# Patient Record
Sex: Female | Born: 1955 | Race: White | Hispanic: No | Marital: Married
Health system: Southern US, Community
[De-identification: ages and names within clinical notes are randomized; demographics above are authoritative.]

---

## 2002-05-18 ENCOUNTER — Encounter: Payer: Self-pay | Admitting: Orthopedic Surgery

## 2002-05-25 ENCOUNTER — Inpatient Hospital Stay (HOSPITAL_COMMUNITY): Admission: RE | Admit: 2002-05-25 | Discharge: 2002-05-29 | Payer: Self-pay | Admitting: Orthopedic Surgery

## 2002-05-25 ENCOUNTER — Encounter: Payer: Self-pay | Admitting: Orthopedic Surgery

## 2012-01-29 DIAGNOSIS — J45901 Unspecified asthma with (acute) exacerbation: Secondary | ICD-10-CM | POA: Diagnosis not present

## 2012-01-29 DIAGNOSIS — J209 Acute bronchitis, unspecified: Secondary | ICD-10-CM | POA: Diagnosis not present

## 2012-02-01 DIAGNOSIS — J209 Acute bronchitis, unspecified: Secondary | ICD-10-CM | POA: Diagnosis not present

## 2012-02-01 DIAGNOSIS — J45901 Unspecified asthma with (acute) exacerbation: Secondary | ICD-10-CM | POA: Diagnosis not present

## 2012-03-23 DIAGNOSIS — I8310 Varicose veins of unspecified lower extremity with inflammation: Secondary | ICD-10-CM | POA: Diagnosis not present

## 2012-03-23 DIAGNOSIS — I872 Venous insufficiency (chronic) (peripheral): Secondary | ICD-10-CM | POA: Diagnosis not present

## 2012-03-24 DIAGNOSIS — R5383 Other fatigue: Secondary | ICD-10-CM | POA: Diagnosis not present

## 2012-03-24 DIAGNOSIS — E669 Obesity, unspecified: Secondary | ICD-10-CM | POA: Diagnosis not present

## 2012-03-24 DIAGNOSIS — Z713 Dietary counseling and surveillance: Secondary | ICD-10-CM | POA: Diagnosis not present

## 2012-03-24 DIAGNOSIS — R632 Polyphagia: Secondary | ICD-10-CM | POA: Diagnosis not present

## 2012-03-24 DIAGNOSIS — R5381 Other malaise: Secondary | ICD-10-CM | POA: Diagnosis not present

## 2012-04-06 DIAGNOSIS — I8310 Varicose veins of unspecified lower extremity with inflammation: Secondary | ICD-10-CM | POA: Diagnosis not present

## 2012-04-21 DIAGNOSIS — R5381 Other malaise: Secondary | ICD-10-CM | POA: Diagnosis not present

## 2012-04-21 DIAGNOSIS — E669 Obesity, unspecified: Secondary | ICD-10-CM | POA: Diagnosis not present

## 2012-05-10 DIAGNOSIS — F339 Major depressive disorder, recurrent, unspecified: Secondary | ICD-10-CM | POA: Diagnosis not present

## 2012-05-10 DIAGNOSIS — M545 Low back pain, unspecified: Secondary | ICD-10-CM | POA: Diagnosis not present

## 2012-05-10 DIAGNOSIS — K2100 Gastro-esophageal reflux disease with esophagitis, without bleeding: Secondary | ICD-10-CM | POA: Diagnosis not present

## 2012-05-10 DIAGNOSIS — J45909 Unspecified asthma, uncomplicated: Secondary | ICD-10-CM | POA: Diagnosis not present

## 2012-06-02 DIAGNOSIS — R5381 Other malaise: Secondary | ICD-10-CM | POA: Diagnosis not present

## 2012-06-02 DIAGNOSIS — R5383 Other fatigue: Secondary | ICD-10-CM | POA: Diagnosis not present

## 2012-06-02 DIAGNOSIS — E669 Obesity, unspecified: Secondary | ICD-10-CM | POA: Diagnosis not present

## 2012-06-30 DIAGNOSIS — R5381 Other malaise: Secondary | ICD-10-CM | POA: Diagnosis not present

## 2012-06-30 DIAGNOSIS — R5383 Other fatigue: Secondary | ICD-10-CM | POA: Diagnosis not present

## 2012-06-30 DIAGNOSIS — E669 Obesity, unspecified: Secondary | ICD-10-CM | POA: Diagnosis not present

## 2012-08-12 DIAGNOSIS — Z23 Encounter for immunization: Secondary | ICD-10-CM | POA: Diagnosis not present

## 2012-12-06 DIAGNOSIS — G2581 Restless legs syndrome: Secondary | ICD-10-CM | POA: Diagnosis not present

## 2012-12-06 DIAGNOSIS — G5600 Carpal tunnel syndrome, unspecified upper limb: Secondary | ICD-10-CM | POA: Diagnosis not present

## 2012-12-06 DIAGNOSIS — G609 Hereditary and idiopathic neuropathy, unspecified: Secondary | ICD-10-CM | POA: Diagnosis not present

## 2013-01-14 DIAGNOSIS — G609 Hereditary and idiopathic neuropathy, unspecified: Secondary | ICD-10-CM | POA: Diagnosis not present

## 2013-01-14 DIAGNOSIS — G5600 Carpal tunnel syndrome, unspecified upper limb: Secondary | ICD-10-CM | POA: Diagnosis not present

## 2013-05-13 DIAGNOSIS — G2581 Restless legs syndrome: Secondary | ICD-10-CM | POA: Diagnosis not present

## 2013-05-13 DIAGNOSIS — G609 Hereditary and idiopathic neuropathy, unspecified: Secondary | ICD-10-CM | POA: Diagnosis not present

## 2013-08-26 DIAGNOSIS — K2100 Gastro-esophageal reflux disease with esophagitis, without bleeding: Secondary | ICD-10-CM | POA: Diagnosis not present

## 2013-08-26 DIAGNOSIS — F339 Major depressive disorder, recurrent, unspecified: Secondary | ICD-10-CM | POA: Diagnosis not present

## 2013-08-26 DIAGNOSIS — Z1211 Encounter for screening for malignant neoplasm of colon: Secondary | ICD-10-CM | POA: Diagnosis not present

## 2013-08-26 DIAGNOSIS — Z23 Encounter for immunization: Secondary | ICD-10-CM | POA: Diagnosis not present

## 2013-12-14 DIAGNOSIS — Z1211 Encounter for screening for malignant neoplasm of colon: Secondary | ICD-10-CM | POA: Diagnosis not present

## 2013-12-14 DIAGNOSIS — D126 Benign neoplasm of colon, unspecified: Secondary | ICD-10-CM | POA: Diagnosis not present

## 2014-08-21 DIAGNOSIS — Z23 Encounter for immunization: Secondary | ICD-10-CM | POA: Diagnosis not present

## 2014-10-30 DIAGNOSIS — E559 Vitamin D deficiency, unspecified: Secondary | ICD-10-CM | POA: Diagnosis not present

## 2014-10-30 DIAGNOSIS — F329 Major depressive disorder, single episode, unspecified: Secondary | ICD-10-CM | POA: Diagnosis not present

## 2014-10-30 DIAGNOSIS — M5137 Other intervertebral disc degeneration, lumbosacral region: Secondary | ICD-10-CM | POA: Diagnosis not present

## 2014-10-30 DIAGNOSIS — K210 Gastro-esophageal reflux disease with esophagitis: Secondary | ICD-10-CM | POA: Diagnosis not present

## 2016-01-14 DIAGNOSIS — B029 Zoster without complications: Secondary | ICD-10-CM | POA: Diagnosis not present

## 2017-04-24 DIAGNOSIS — Z79899 Other long term (current) drug therapy: Secondary | ICD-10-CM | POA: Diagnosis not present

## 2017-04-24 DIAGNOSIS — G4710 Hypersomnia, unspecified: Secondary | ICD-10-CM | POA: Diagnosis not present

## 2017-04-24 DIAGNOSIS — Z13820 Encounter for screening for osteoporosis: Secondary | ICD-10-CM | POA: Diagnosis not present

## 2017-04-24 DIAGNOSIS — R0683 Snoring: Secondary | ICD-10-CM | POA: Diagnosis not present

## 2017-04-24 DIAGNOSIS — Z1231 Encounter for screening mammogram for malignant neoplasm of breast: Secondary | ICD-10-CM | POA: Diagnosis not present

## 2017-05-06 DIAGNOSIS — Z1231 Encounter for screening mammogram for malignant neoplasm of breast: Secondary | ICD-10-CM | POA: Diagnosis not present

## 2017-05-18 DIAGNOSIS — G4733 Obstructive sleep apnea (adult) (pediatric): Secondary | ICD-10-CM | POA: Diagnosis not present

## 2017-05-18 DIAGNOSIS — G4730 Sleep apnea, unspecified: Secondary | ICD-10-CM | POA: Diagnosis not present

## 2017-07-02 DIAGNOSIS — G4733 Obstructive sleep apnea (adult) (pediatric): Secondary | ICD-10-CM | POA: Diagnosis not present

## 2017-07-06 DIAGNOSIS — D519 Vitamin B12 deficiency anemia, unspecified: Secondary | ICD-10-CM | POA: Diagnosis not present

## 2017-07-06 DIAGNOSIS — Z0000 Encounter for general adult medical examination without abnormal findings: Secondary | ICD-10-CM | POA: Diagnosis not present

## 2017-07-06 DIAGNOSIS — E669 Obesity, unspecified: Secondary | ICD-10-CM | POA: Diagnosis not present

## 2017-07-06 DIAGNOSIS — G894 Chronic pain syndrome: Secondary | ICD-10-CM | POA: Diagnosis not present

## 2017-07-06 DIAGNOSIS — G629 Polyneuropathy, unspecified: Secondary | ICD-10-CM | POA: Diagnosis not present

## 2017-07-06 DIAGNOSIS — I872 Venous insufficiency (chronic) (peripheral): Secondary | ICD-10-CM | POA: Diagnosis not present

## 2017-07-06 DIAGNOSIS — G2581 Restless legs syndrome: Secondary | ICD-10-CM | POA: Diagnosis not present

## 2017-07-06 DIAGNOSIS — G4730 Sleep apnea, unspecified: Secondary | ICD-10-CM | POA: Diagnosis not present

## 2017-07-06 DIAGNOSIS — J4520 Mild intermittent asthma, uncomplicated: Secondary | ICD-10-CM | POA: Diagnosis not present

## 2017-07-06 DIAGNOSIS — H9319 Tinnitus, unspecified ear: Secondary | ICD-10-CM | POA: Diagnosis not present

## 2017-08-02 DIAGNOSIS — G4733 Obstructive sleep apnea (adult) (pediatric): Secondary | ICD-10-CM | POA: Diagnosis not present

## 2017-09-01 DIAGNOSIS — G4733 Obstructive sleep apnea (adult) (pediatric): Secondary | ICD-10-CM | POA: Diagnosis not present

## 2017-10-02 DIAGNOSIS — G4733 Obstructive sleep apnea (adult) (pediatric): Secondary | ICD-10-CM | POA: Diagnosis not present

## 2017-10-03 DIAGNOSIS — G4733 Obstructive sleep apnea (adult) (pediatric): Secondary | ICD-10-CM | POA: Diagnosis not present

## 2021-12-02 ENCOUNTER — Emergency Department (HOSPITAL_COMMUNITY): Payer: Medicare HMO

## 2021-12-02 ENCOUNTER — Other Ambulatory Visit: Payer: Self-pay

## 2021-12-02 ENCOUNTER — Observation Stay (HOSPITAL_COMMUNITY)
Admission: EM | Admit: 2021-12-02 | Discharge: 2021-12-05 | Disposition: A | Discharge status: Home / Self Care | Payer: Medicare HMO | Attending: Internal Medicine | Admitting: Internal Medicine

## 2021-12-02 DIAGNOSIS — T490X5A Adverse effect of local antifungal, anti-infective and anti-inflammatory drugs, initial encounter: Secondary | ICD-10-CM | POA: Insufficient documentation

## 2021-12-02 DIAGNOSIS — R21 Rash and other nonspecific skin eruption: Secondary | ICD-10-CM | POA: Diagnosis present

## 2021-12-02 DIAGNOSIS — D7212 Drug rash with eosinophilia and systemic symptoms syndrome: Principal | ICD-10-CM | POA: Insufficient documentation

## 2021-12-02 DIAGNOSIS — R000 Tachycardia, unspecified: Secondary | ICD-10-CM

## 2021-12-02 DIAGNOSIS — Z7982 Long term (current) use of aspirin: Secondary | ICD-10-CM | POA: Insufficient documentation

## 2021-12-02 DIAGNOSIS — Z20822 Contact with and (suspected) exposure to covid-19: Secondary | ICD-10-CM | POA: Diagnosis not present

## 2021-12-02 DIAGNOSIS — G2581 Restless legs syndrome: Secondary | ICD-10-CM

## 2021-12-02 DIAGNOSIS — Z79899 Other long term (current) drug therapy: Secondary | ICD-10-CM | POA: Diagnosis not present

## 2021-12-02 DIAGNOSIS — D72829 Elevated white blood cell count, unspecified: Secondary | ICD-10-CM

## 2021-12-02 DIAGNOSIS — J45909 Unspecified asthma, uncomplicated: Secondary | ICD-10-CM | POA: Diagnosis not present

## 2021-12-02 DIAGNOSIS — R509 Fever, unspecified: Secondary | ICD-10-CM

## 2021-12-02 DIAGNOSIS — K219 Gastro-esophageal reflux disease without esophagitis: Secondary | ICD-10-CM | POA: Diagnosis present

## 2021-12-02 DIAGNOSIS — T7840XA Allergy, unspecified, initial encounter: Secondary | ICD-10-CM | POA: Diagnosis present

## 2021-12-02 LAB — BASIC METABOLIC PANEL
Anion gap: 11 (ref 5–15)
BUN: 18 mg/dL (ref 8–23)
CO2: 22 mmol/L (ref 22–32)
Calcium: 8.8 mg/dL — ABNORMAL LOW (ref 8.9–10.3)
Chloride: 103 mmol/L (ref 98–111)
Creatinine, Ser: 1.1 mg/dL — ABNORMAL HIGH (ref 0.44–1.00)
GFR, Estimated: 56 mL/min — ABNORMAL LOW (ref >60)
Glucose, Bld: 113 mg/dL — ABNORMAL HIGH (ref 70–99)
Potassium: 4 mmol/L (ref 3.5–5.1)
Sodium: 136 mmol/L (ref 135–145)

## 2021-12-02 LAB — CBC WITH DIFFERENTIAL/PLATELET
Abs Immature Granulocytes: 0.05 10*3/uL (ref 0.00–0.07)
Basophils Absolute: 0 10*3/uL (ref 0.0–0.1)
Basophils Relative: 0 %
Eosinophils Absolute: 0.1 10*3/uL (ref 0.0–0.5)
Eosinophils Relative: 1 %
HCT: 48.5 % — ABNORMAL HIGH (ref 36.0–46.0)
Hemoglobin: 16.7 g/dL — ABNORMAL HIGH (ref 12.0–15.0)
Immature Granulocytes: 0 %
Lymphocytes Relative: 18 %
Lymphs Abs: 2.3 10*3/uL (ref 0.7–4.0)
MCH: 31.3 pg (ref 26.0–34.0)
MCHC: 34.4 g/dL (ref 30.0–36.0)
MCV: 90.8 fL (ref 80.0–100.0)
Monocytes Absolute: 0.2 10*3/uL (ref 0.1–1.0)
Monocytes Relative: 2 %
Neutro Abs: 10.4 10*3/uL — ABNORMAL HIGH (ref 1.7–7.7)
Neutrophils Relative %: 79 %
Platelets: 262 10*3/uL (ref 150–400)
RBC: 5.34 MIL/uL — ABNORMAL HIGH (ref 3.87–5.11)
RDW: 13.3 % (ref 11.5–15.5)
WBC: 13 10*3/uL — ABNORMAL HIGH (ref 4.0–10.5)
nRBC: 0 % (ref 0.0–0.2)

## 2021-12-02 LAB — HEPATITIS PANEL, ACUTE
HCV Ab: NONREACTIVE
Hep A IgM: NONREACTIVE
Hep B C IgM: NONREACTIVE
Hepatitis B Surface Ag: NONREACTIVE

## 2021-12-02 LAB — HEPATIC FUNCTION PANEL
ALT: 17 U/L (ref 0–44)
AST: 18 U/L (ref 15–41)
Albumin: 4.1 g/dL (ref 3.5–5.0)
Alkaline Phosphatase: 71 U/L (ref 38–126)
Bilirubin, Direct: 0.2 mg/dL (ref 0.0–0.2)
Indirect Bilirubin: 1.3 mg/dL — ABNORMAL HIGH (ref 0.3–0.9)
Total Bilirubin: 1.5 mg/dL — ABNORMAL HIGH (ref 0.3–1.2)
Total Protein: 6.8 g/dL (ref 6.5–8.1)

## 2021-12-02 LAB — RESP PANEL BY RT-PCR (FLU A&B, COVID) ARPGX2
Influenza A by PCR: NEGATIVE
Influenza B by PCR: NEGATIVE
SARS Coronavirus 2 by RT PCR: NEGATIVE

## 2021-12-02 LAB — SEDIMENTATION RATE: Sed Rate: 3 mm/hr (ref 0–22)

## 2021-12-02 LAB — C-REACTIVE PROTEIN: CRP: 5.3 mg/dL — ABNORMAL HIGH (ref <1.0)

## 2021-12-02 MED ORDER — DIPHENHYDRAMINE HCL 50 MG/ML IJ SOLN
50.0000 mg | Freq: Once | INTRAMUSCULAR | Status: AC
  Administered 2021-12-02: 50 mg via INTRAVENOUS
  Filled 2021-12-02: qty 1

## 2021-12-02 MED ORDER — SODIUM CHLORIDE 0.9 % IV BOLUS
1000.0000 mL | Freq: Once | INTRAVENOUS | Status: AC
  Administered 2021-12-02: 1000 mL via INTRAVENOUS

## 2021-12-02 NOTE — ED Triage Notes (Signed)
Pt sent here by PCP for r/o DRESS syndrome or SJS. Pt had tooth infection on 1/3 was put on amoxicillin, didn't work, they changed it to Clindamycin. Then pt started developing rash so they stopped the clinda. Pt then prescribed on Sunday Kinglog shot and nyastatin topical. Pt still has rash all over body, red and itchy, some blisters. Pt has had fevers at home 100.6.

## 2021-12-02 NOTE — ED Notes (Signed)
Admitting at bedside 

## 2021-12-02 NOTE — ED Provider Notes (Signed)
[facility] EMERGENCY DEPARTMENT Provider Note   CSN: [id number] Arrival date & time: 12/02/21  1638     History  Chief Complaint  Patient presents with   Allergic Reaction    Kiara King is a 66 y.o. female.  The history is provided by the patient, medical records and the spouse. No language interpreter was used.  Allergic Reaction Presenting symptoms: itching and rash   Presenting symptoms: no difficulty breathing, no difficulty swallowing and no wheezing   Severity:  Severe Duration:  3 days Prior allergic episodes:  No prior episodes Context: medications (amox and clinda)   Relieved by:  Antihistamines and steroids (mildly but then failing) Worsened by:  Nothing Ineffective treatments:  Steroids and antihistamines     Home Medications Prior to Admission medications   Not on File      Allergies    Morphine and related, Tomato, and Clindamycin/lincomycin    Review of Systems   Review of Systems  Constitutional:  Positive for fatigue and fever. Negative for chills.  HENT:  Negative for congestion and trouble swallowing.   Eyes:  Negative for visual disturbance.  Respiratory:  Negative for cough, chest tightness, shortness of breath and wheezing.   Cardiovascular:  Negative for chest pain and palpitations.  Gastrointestinal:  Negative for abdominal pain, constipation, diarrhea, nausea and vomiting.  Genitourinary:  Negative for dysuria and flank pain.  Musculoskeletal:  Negative for back pain, neck pain and neck stiffness.  Skin:  Positive for itching and rash. Negative for wound.  Neurological:  Negative for headaches.  Psychiatric/Behavioral:  Negative for agitation and confusion.   All other systems reviewed and are negative.  Physical Exam Updated Vital Signs BP (!) 149/135    Pulse (!) 112    Temp 99 F (37.2 C) (Oral)    Resp 13    SpO2 100%  Physical Exam Vitals and nursing note reviewed.  Constitutional:      General: She is  not in acute distress.    Appearance: She is well-developed. She is not ill-appearing, toxic-appearing or diaphoretic.  HENT:     Head: Normocephalic and atraumatic.     Nose: Nose normal.     Mouth/Throat:     Mouth: Mucous membranes are dry.     Pharynx: No oropharyngeal exudate or posterior oropharyngeal erythema.  Eyes:     Conjunctiva/sclera: Conjunctivae normal.     Pupils: Pupils are equal, round, and reactive to light.  Cardiovascular:     Rate and Rhythm: Regular rhythm. Tachycardia present.     Heart sounds: No murmur heard. Pulmonary:     Effort: Pulmonary effort is normal. No respiratory distress.     Breath sounds: Normal breath sounds. No wheezing, rhonchi or rales.  Chest:     Chest wall: No tenderness.  Abdominal:     General: Abdomen is flat.     Palpations: Abdomen is soft.     Tenderness: There is no abdominal tenderness. There is no right CVA tenderness, left CVA tenderness, guarding or rebound.  Musculoskeletal:        General: No swelling or tenderness.     Cervical back: Neck supple. No tenderness.     Right lower leg: No edema.     Left lower leg: No edema.  Skin:    General: Skin is warm and dry.     Capillary Refill: Capillary refill takes less than 2 seconds.     Findings: Erythema and rash present.  Neurological:  General: No focal deficit present.     Mental Status: She is alert and oriented to person, place, and time.     Sensory: No sensory deficit.     Motor: No weakness.  Psychiatric:        Mood and Affect: Mood normal.           ED Results / Procedures / Treatments   Labs (all labs ordered are listed, but only abnormal results are displayed) Labs Reviewed  CBC WITH DIFFERENTIAL/PLATELET - Abnormal; Notable for the following components:      Result Value   WBC 13.0 (*)    RBC 5.34 (*)    Hemoglobin 16.7 (*)    HCT 48.5 (*)    Neutro Abs 10.4 (*)    All other components within normal limits  BASIC METABOLIC PANEL -  Abnormal; Notable for the following components:   Glucose, Bld 113 (*)    Creatinine, Ser 1.10 (*)    Calcium 8.8 (*)    GFR, Estimated 56 (*)    All other components within normal limits  C-REACTIVE PROTEIN - Abnormal; Notable for the following components:   CRP 5.3 (*)    All other components within normal limits  HEPATIC FUNCTION PANEL - Abnormal; Notable for the following components:   Total Bilirubin 1.5 (*)    Indirect Bilirubin 1.3 (*)    All other components within normal limits  RESP PANEL BY RT-PCR (FLU A&B, COVID) [id number]  SEDIMENTATION RATE  HEPATITIS PANEL, ACUTE    EKG EKG Interpretation  Date/Time:  Monday December 02 2021 18:39:30 EST Ventricular Rate:  123 PR Interval:  132 QRS Duration: 68 QT Interval:  328 QTC Calculation: 469 R Axis:   -68 Text Interpretation: Sinus tachycardia Left axis deviation Low voltage QRS Cannot rule out Anterior infarct , age undetermined Abnormal ECG No previous ECGs available No prior ECG. No STEMI Confirmed by Antony Blackbird [phone]) on 12/02/2021 7:59:31 PM  Radiology DG Chest 1 View  Result Date: 12/02/2021 CLINICAL DATA:  Allergic reaction to medication. EXAM: CHEST  1 VIEW COMPARISON:  None. FINDINGS: The heart size and mediastinal contours are within normal limits. Both lungs are clear. Multilevel degenerative changes seen throughout the thoracic spine. IMPRESSION: No active disease. Electronically Signed   By: Virgina Norfolk M.D.   On: 12/02/2021 19:47    Procedures Procedures    Medications Ordered in ED Medications  diphenhydrAMINE (BENADRYL) injection 50 mg (50 mg Intravenous Given 12/02/21 2039)  sodium chloride 0.9 % bolus 1,000 mL (0 mLs Intravenous Stopped 12/02/21 2352)    ED Course/ Medical Decision Making/ A&P                           Medical Decision Making  Kiara King is a 66 y.o. female with past medical history significant for recent dental infection who presents at the direction of PCP for  evaluation and rule out a possible dress syndrome causing fever and diffuse rash.  According to patient, around 2 weeks ago she was put on amoxicillin for dental infection.  She then was switched to clindamycin which she continues to take until 3 days ago when she is are developing a rash.  She reports the rash started diffusely and has been spreading.  Very pruritic but is coalescing more.  Today it started getting on her face and she developed a fever of 100.9 at home.  She reports it is a painful rash.  She denies any nausea, vomiting, constipation, diarrhea, or urinary changes.  She reports she is no longer having any pain in her jaw.  No difficulty breathing or swallowing.  No wheezing reported.  Due to the fever and worsening rash, PCP instructed her to come the emergency department for evaluation and rule out of dress syndrome versus Stevens-Johnson syndrome.  On my exam, patient does have what appears to be urticarial and coalescing rash in her extremities, torso, and face.  Also in her upper legs.  It is pruritic and painful but it is blanching.  There does not appear to be rash inside her mouth and do not see any injected conjunctiva on her eyes.  She is refusing any rash in the groin.  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  Lungs clear with no wheezing.  Chest and abdomen otherwise nontender.  No focal neurologic deficits.  Oropharyngeal exam does not show any tenderness or evidence of dental abscess at this time.  Patient was seen in triage initially and had some screening lab work started.  Patient is indeed presenting somewhat concerning for a dress syndrome.  Work-up continue to return.  Patient was negative for COVID and flu and her ESR was negative.  CRP was elevated at 5.3 and she does have a leukocytosis of 13.0.  She has increase in neutrophils but does not appear to show elevation in eosinophils at this time.   Creatinine slightly more elevated than her baseline but otherwise electrolytes reassuring.  Mild hypocalcemia.  Hepatic function shows elevated bilirubin but otherwise normal LFTs.  Chest x-ray does not show pneumonia and EKG showed sinus tachycardia.  Clinically I am concerned that patient may indeed need dermatologic management.  As this facility does not have give ability to take care of complex dermatology patients, will call [facility] health to discuss a medicine admission so that dermatology can see her and help guide further management.  Patient is agreement with this plan.  Will call transfer line for admission.  11:12 PM Just spoke to the [facility] transfer line.  Spoke to Dr. Lovena Le with dermatology.  We discussed management for typical dress patient and she does feel it is reasonable to admit to the hospital and give between 1-2 mix per cake of prednisone to start a taper and continue to monitor and give fluids.  She did report that unfortunately there are no beds available at [facility] and they are not taking any patients for the wait list so patient will need to be admitted here.  She said that if things were to worsen from a lab or vital sign standpoint, they should be called back as the patient may need an ICU level of care.  Will call medicine for admission for further management of suspected dress syndrome.        Final Clinical Impression(s) / ED Diagnoses Final diagnoses:  Fever, unspecified fever cause  Rash     Clinical Impression: 1. Fever, unspecified fever cause   2. Allergic reaction   3. Rash     Disposition: Admit  This note was prepared with assistance of Dragon voice recognition software. Occasional wrong-word or sound-a-like substitutions may have occurred due to the inherent limitations of voice recognition software.     Yahmir Sokolov, Gwenyth Allegra, MD 12/03/21 [phone]

## 2021-12-02 NOTE — ED Notes (Signed)
Bp cuff irritating pt's arm, removed with will recheck bp as needed

## 2021-12-02 NOTE — ED Provider Triage Note (Addendum)
Emergency Medicine Provider Triage Evaluation Note  Kiara King , a 66 y.o. female  was evaluated in triage.  Pt complains of rash onset 3 days. She was evaluated by her primary care provider on Sunday and today and told to follow-up in the ED for evaluation.  She has had associated fever (max temp 101 today).  Has tried Tylenol with relief of her symptoms.  Denies chest pain, shortness of breath, sore throat, trouble swallowing, chills, nausea, vomiting, abdominal pain.  Denies any new pets, environments, lotions, soaps, detergent, shampoo, conditioner.  Patient notes she was started on amoxicillin for a tooth abscess on 11/19/2021 however patient did not feel improvement with the antibiotic so she took an old prescription of clindamycin that was for another concern and took about 4 days of the prescription with her last dose being 3 days ago.  She stopped the clindamycin when she noticed the rash.  Review of Systems  Positive: As per HPI above. Negative: Chest pain, shortness of breath  Physical Exam  BP 101/75 (BP Location: Right Arm)    Pulse (!) 123    Temp 99 F (37.2 C) (Oral)    Resp 20    SpO2 100%  Gen:   Awake, no distress   Resp:  Normal effort  MSK:   Moves extremities without difficulty  Other:  Urticarial rash noted to torso, bilateral upper extremities that blanches. Patent airway.  No appreciable fluctuance noted to left upper teeth.  No erythema noted to gumline.  Medical Decision Making  Medically screening exam initiated at 6:15 PM.  Appropriate orders placed.  Cephus Shelling Safranek was informed that the remainder of the evaluation will be completed by another provider, this initial triage assessment does not replace that evaluation, and the importance of remaining in the ED until their evaluation is complete.   Andalyn Heckstall A, PA-C 12/02/21 1831    Serrena Linderman A, PA-C 12/02/21 1853

## 2021-12-03 ENCOUNTER — Encounter (HOSPITAL_COMMUNITY): Payer: Self-pay | Admitting: Internal Medicine

## 2021-12-03 DIAGNOSIS — D72829 Elevated white blood cell count, unspecified: Secondary | ICD-10-CM

## 2021-12-03 DIAGNOSIS — G2581 Restless legs syndrome: Secondary | ICD-10-CM

## 2021-12-03 DIAGNOSIS — R21 Rash and other nonspecific skin eruption: Secondary | ICD-10-CM | POA: Diagnosis present

## 2021-12-03 DIAGNOSIS — T7840XA Allergy, unspecified, initial encounter: Secondary | ICD-10-CM

## 2021-12-03 DIAGNOSIS — K219 Gastro-esophageal reflux disease without esophagitis: Secondary | ICD-10-CM

## 2021-12-03 DIAGNOSIS — R000 Tachycardia, unspecified: Secondary | ICD-10-CM

## 2021-12-03 LAB — CBC
HCT: 42.9 % (ref 36.0–46.0)
Hemoglobin: 14.7 g/dL (ref 12.0–15.0)
MCH: 31.6 pg (ref 26.0–34.0)
MCHC: 34.3 g/dL (ref 30.0–36.0)
MCV: 92.3 fL (ref 80.0–100.0)
Platelets: 214 10*3/uL (ref 150–400)
RBC: 4.65 MIL/uL (ref 3.87–5.11)
RDW: 13.3 % (ref 11.5–15.5)
WBC: 11.2 10*3/uL — ABNORMAL HIGH (ref 4.0–10.5)
nRBC: 0 % (ref 0.0–0.2)

## 2021-12-03 LAB — COMPREHENSIVE METABOLIC PANEL
ALT: 16 U/L (ref 0–44)
AST: 20 U/L (ref 15–41)
Albumin: 3.4 g/dL — ABNORMAL LOW (ref 3.5–5.0)
Alkaline Phosphatase: 60 U/L (ref 38–126)
Anion gap: 10 (ref 5–15)
BUN: 16 mg/dL (ref 8–23)
CO2: 22 mmol/L (ref 22–32)
Calcium: 8.2 mg/dL — ABNORMAL LOW (ref 8.9–10.3)
Chloride: 102 mmol/L (ref 98–111)
Creatinine, Ser: 1 mg/dL (ref 0.44–1.00)
GFR, Estimated: >60 mL/min (ref >60)
Glucose, Bld: 195 mg/dL — ABNORMAL HIGH (ref 70–99)
Potassium: 4.7 mmol/L (ref 3.5–5.1)
Sodium: 134 mmol/L — ABNORMAL LOW (ref 135–145)
Total Bilirubin: 1.9 mg/dL — ABNORMAL HIGH (ref 0.3–1.2)
Total Protein: 6 g/dL — ABNORMAL LOW (ref 6.5–8.1)

## 2021-12-03 LAB — HIV ANTIBODY (ROUTINE TESTING W REFLEX): HIV Screen 4th Generation wRfx: NONREACTIVE

## 2021-12-03 MED ORDER — ENOXAPARIN SODIUM 40 MG/0.4ML IJ SOSY
40.0000 mg | PREFILLED_SYRINGE | Freq: Every day | INTRAMUSCULAR | Status: DC
  Administered 2021-12-03 – 2021-12-04 (×2): 40 mg via SUBCUTANEOUS
  Filled 2021-12-03 (×3): qty 0.4

## 2021-12-03 MED ORDER — ONDANSETRON HCL 4 MG/2ML IJ SOLN
4.0000 mg | Freq: Four times a day (QID) | INTRAMUSCULAR | Status: DC | PRN
  Filled 2021-12-03: qty 2

## 2021-12-03 MED ORDER — METHYLPREDNISOLONE SODIUM SUCC 125 MG IJ SOLR
125.0000 mg | Freq: Once | INTRAMUSCULAR | Status: AC
  Administered 2021-12-03: 125 mg via INTRAVENOUS
  Filled 2021-12-03: qty 2

## 2021-12-03 MED ORDER — ROPINIROLE HCL 1 MG PO TABS
3.0000 mg | ORAL_TABLET | Freq: Every day | ORAL | Status: DC
  Administered 2021-12-03 – 2021-12-04 (×2): 3 mg via ORAL
  Filled 2021-12-03 (×2): qty 3

## 2021-12-03 MED ORDER — ACETAMINOPHEN 500 MG PO TABS: 500.0000 mg | ORAL_TABLET | Freq: Four times a day (QID) | ORAL | Status: DC | PRN

## 2021-12-03 MED ORDER — PANTOPRAZOLE SODIUM 40 MG PO TBEC
40.0000 mg | DELAYED_RELEASE_TABLET | Freq: Every day | ORAL | Status: DC
  Administered 2021-12-03: 40 mg via ORAL

## 2021-12-03 MED ORDER — FAMOTIDINE IN NACL 20-0.9 MG/50ML-% IV SOLN
20.0000 mg | Freq: Two times a day (BID) | INTRAVENOUS | Status: DC
  Administered 2021-12-03 – 2021-12-04 (×4): 20 mg via INTRAVENOUS
  Filled 2021-12-03 (×5): qty 50

## 2021-12-03 MED ORDER — DIPHENHYDRAMINE HCL 25 MG PO CAPS: 25.0000 mg | ORAL_CAPSULE | Freq: Four times a day (QID) | ORAL | Status: DC | PRN

## 2021-12-03 MED ORDER — ASPIRIN EC 81 MG PO TBEC
81.0000 mg | DELAYED_RELEASE_TABLET | Freq: Every day | ORAL | Status: DC
  Administered 2021-12-03 – 2021-12-05 (×3): 81 mg via ORAL
  Filled 2021-12-03 (×3): qty 1

## 2021-12-03 MED ORDER — DIPHENHYDRAMINE HCL 50 MG/ML IJ SOLN
12.5000 mg | Freq: Four times a day (QID) | INTRAMUSCULAR | Status: DC | PRN
  Administered 2021-12-03 – 2021-12-05 (×5): 12.5 mg via INTRAVENOUS
  Filled 2021-12-03 (×5): qty 1

## 2021-12-03 MED ORDER — LACTATED RINGERS IV SOLN: INTRAVENOUS | Status: AC

## 2021-12-03 MED ORDER — METHYLPREDNISOLONE SODIUM SUCC 125 MG IJ SOLR
60.0000 mg | Freq: Every day | INTRAMUSCULAR | Status: DC
  Administered 2021-12-04: 60 mg via INTRAVENOUS
  Filled 2021-12-03: qty 2

## 2021-12-03 MED ORDER — TRAMADOL HCL 50 MG PO TABS
50.0000 mg | ORAL_TABLET | Freq: Three times a day (TID) | ORAL | Status: DC | PRN
Start: 2021-12-03 — End: 2021-12-05

## 2021-12-03 NOTE — Progress Notes (Signed)
FacePatient ID: Kiara King, female   DOB: 1956-03-03, 66 y.o.   MRN: [id number] Patient admitted early this morning for rash and fever and has been started on IV Solu-Medrol.  Patient seen and examined at bedside and plan of care discussed with her.  I have reviewed patient's medical records, current vitals, labs and medications myself.  Continue Solu-Medrol.  Patient states that her rash is improving.  Monitor for extension of rash and monitor vitals.  Continue IV fluids.

## 2021-12-03 NOTE — ED Notes (Signed)
Pt ambulatory to restroom with steady gait.

## 2021-12-03 NOTE — ED Notes (Signed)
Lunch Ordered °

## 2021-12-03 NOTE — ED Notes (Signed)
Sandwiches provided

## 2021-12-03 NOTE — ED Notes (Signed)
Placed Breakfast Order 

## 2021-12-03 NOTE — H&P (Signed)
History and Physical    Kiara King:[id number] DOB: 1956/06/17 DOA: 12/02/2021  PCP: Kiara Lovely, MD  Patient coming from: Home  I have personally briefly reviewed patient's old medical records in [place]  Chief Complaint: Rash and fever  HPI: Kiara King is a 66 y.o. female with medical history significant for asthma, GERD, OSA not on CPAP, restless leg syndrome who presents with concerns of rash and fever.  About a week ago patient had a crown that broke off her right upper molar and had tooth pain and facial swelling.  She was prescribed amoxicillin by her dentist and took it for 4 days without relief.  Then she switched to old prescriptions of clindamycin that she had at home.  After 2 days of taking it her back began to itch and she noticed an erythematous rash that spread to her abdomen, chest, upper extremity, inner thigh and to her face within about 24 hours.  She denies other inciting factors including new soaps, lotions or detergents. She presented to urgent care yesterday and was given 40 mg of Kenalog and nystatin cream.  Today she also developed a fever to 100.9 at home and PCP asked her to present to the ED.  She no longer has right tooth pain and says that her facial swelling has resolved.  Denies any chest pain or shortness of breath.  Has mild nausea but no vomiting.  ED Course: She had a temperature of 42F, tachycardic up to the 120s normotensive on room air. Leukocytosis of 13 and neutrophilia of 10.4.  Normal eosinophil.  Creatinine mildly elevated 1.1.  Total bilirubin mildly elevated at 1.5 with normal LFTs.  CRP is elevated to 5.3 with normal sed rate. Chest x-ray was negative.  Negative flu and COVID PCR.  Patient did not have mucosal involvement of her rash.  No blistering or sloughing of skin.  Lower concerns for Stevens-Johnson's.  However given fever with rash, there is concern of DRESS syndrome. ED physician Dr. Sherry Ruffing reached  out to [facility] since there is no available in-house dermatologist at [facility]. Unfortunately there are no beds available there. He was able to discuss case with on-call dermatologist Dr. Lovena Le at [facility] who feels she can be admitted here with 1-2mg /kg of steroid and to start taper and continue to monitor and give fluids. If repeat labs worsen, could then consider transfer to ICU level of care.   Review of Systems: Constitutional: No Weight Change, + Fever ENT/Mouth: No sore throat, No Rhinorrhea Eyes:No Vision Changes Cardiovascular: No Chest Pain, no SOB, No Edema, No Palpitations Respiratory: No Cough, No Sputum, No Wheezing, no Dyspnea  Gastrointestinal: No Nausea, No Vomiting, No Diarrhea, No Constipation, No Pain Genitourinary: no Urinary Incontinence, No Urgency, No Flank Pain Musculoskeletal: No Arthralgias, No Myalgias Skin: + Skin Lesions, No Pruritus, Neuro: no Weakness, No Numbness Psych: no decrease appetite Heme/Lymph: No Bruising, No Bleeding   Social Pt is married. No tobacco, alcohol or illicit drug use.  Allergies  Allergen Reactions   Morphine And Related Nausea And Vomiting   Tomato Hives   Clindamycin/Lincomycin Rash    No family history on file.   Prior to Admission medications   Medication Sig Start Date End Date Taking? Authorizing Provider  acetaminophen (TYLENOL) 500 MG tablet Take 500 mg by mouth every 6 (six) hours as needed for mild pain.   Yes [provider]  amoxicillin-clavulanate (AUGMENTIN) 875-125 MG tablet Take 1 tablet by mouth  2 (two) times daily. 11/19/21  Yes [provider]  aspirin EC 81 MG tablet Take 81 mg by mouth daily. Swallow whole.   Yes [provider]  DULoxetine (CYMBALTA) 60 MG capsule Take 60 mg by mouth daily. 09/10/21  Yes [provider]  OMEPRAZOLE PO Take 1 capsule by mouth daily.   Yes [provider]  rOPINIRole (REQUIP) 3 MG tablet Take 3 mg by mouth at bedtime. 11/15/21   Yes [provider]  traMADol (ULTRAM) 50 MG tablet Take 50 mg by mouth 3 (three) times daily as needed for moderate pain. 11/02/21  Yes [provider]    Physical Exam: Vitals:   12/02/21 1758 12/02/21 1943 12/02/21 2035 12/03/21 0015  BP: 101/75 134/64 (!) 149/135 135/75  Pulse: (!) 123 (!) 114 (!) 112 88  Resp: 20 18 13 17   Temp: 99 F (37.2 C)   98.9 F (37.2 C)  TempSrc: Oral     SpO2: 100% 98% 100% 98%    Constitutional: NAD, calm, comfortable, fatigue appearing obesity female laying flat in bed  Vitals:   12/02/21 1758 12/02/21 1943 12/02/21 2035 12/03/21 0015  BP: 101/75 134/64 (!) 149/135 135/75  Pulse: (!) 123 (!) 114 (!) 112 88  Resp: 20 18 13 17   Temp: 99 F (37.2 C)   98.9 F (37.2 C)  TempSrc: Oral     SpO2: 100% 98% 100% 98%   Eyes: lids and conjunctivae normal without injection or erythema ENMT: Mucous membranes are moist with no lesions or ulcers.  Clear posterior oropharynx without erythema or exudate.  Has poor dentition with several teeth implants.  No facial edema or pain with palpation of right upper jaw around previous tooth infection site.  No angioedema. Neck: normal, supple Respiratory: clear to auscultation bilaterally, no wheezing, no crackles. Normal respiratory effort on room air. No accessory muscle use.  Cardiovascular: Regular rate and rhythm, no murmurs / rubs / gallops. No extremity edema.  Abdomen: no tenderness,  Bowel sounds positive.  Musculoskeletal: no clubbing / cyanosis. No joint deformity upper and lower extremities. Good ROM, no contractures. Normal muscle tone.  Skin: Horizontal dermatographic uticaria on mid and lower back, coalescing blanching erythematous urticarial rash throughout the abdomen, breast and chest.  Similar rash throughout upper extremity worse in the antecubital region.  Rash also seen on inner thigh but sparing the lower extremity and genital region. Rash also along hairline and maxillary region  bilaterally.  Rash is pruritic with pt scratching repeatedly all over her body.        Neurologic: CN 2-12 grossly intact.  Strength 5/5 in all 4.  Psychiatric: Normal judgment and insight. Alert and oriented x 3. Normal mood.     Labs on Admission: I have personally reviewed following labs and imaging studies  CBC: Recent Labs  Lab 12/02/21 1826  WBC 13.0*  NEUTROABS 10.4*  HGB 16.7*  HCT 48.5*  MCV 90.8  PLT 599   Basic Metabolic Panel: Recent Labs  Lab 12/02/21 1826  NA 136  K 4.0  CL 103  CO2 22  GLUCOSE 113*  BUN 18  CREATININE 1.10*  CALCIUM 8.8*   GFR: CrCl cannot be calculated (Unknown ideal weight.). Liver Function Tests: Recent Labs  Lab 12/02/21 1837  AST 18  ALT 17  ALKPHOS 71  BILITOT 1.5*  PROT 6.8  ALBUMIN 4.1   No results for input(s): LIPASE, AMYLASE in the last 168 hours. No results for input(s): AMMONIA in the last  168 hours. Coagulation Profile: No results for input(s): INR, PROTIME in the last 168 hours. Cardiac Enzymes: No results for input(s): CKTOTAL, CKMB, CKMBINDEX, TROPONINI in the last 168 hours. BNP (last 3 results) No results for input(s): PROBNP in the last 8760 hours. HbA1C: No results for input(s): HGBA1C in the last 72 hours. CBG: No results for input(s): GLUCAP in the last 168 hours. Lipid Profile: No results for input(s): CHOL, HDL, LDLCALC, TRIG, CHOLHDL, LDLDIRECT in the last 72 hours. Thyroid Function Tests: No results for input(s): TSH, T4TOTAL, FREET4, T3FREE, THYROIDAB in the last 72 hours. Anemia Panel: No results for input(s): VITAMINB12, FOLATE, FERRITIN, TIBC, IRON, RETICCTPCT in the last 72 hours. Urine analysis: No results found for: COLORURINE, APPEARANCEUR, LABSPEC, PHURINE, GLUCOSEU, [place], BILIRUBINUR, KETONESUR, PROTEINUR, UROBILINOGEN, NITRITE, LEUKOCYTESUR  Radiological Exams on Admission: DG Chest 1 View  Result Date: 12/02/2021 CLINICAL DATA:  Allergic reaction to medication. EXAM:  CHEST  1 VIEW COMPARISON:  None. FINDINGS: The heart size and mediastinal contours are within normal limits. Both lungs are clear. Multilevel degenerative changes seen throughout the thoracic spine. IMPRESSION: No active disease. Electronically Signed   By: Virgina Norfolk M.D.   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 -No squamatization, blisters oral involvement concerning for SJS -No obvious worsening lab findings and other symptoms to suggest organ involvement  - No beds available at [facility] to facilitate transfer for dermatology consult. ED Tegeler was able to discuss case with on-call dermatologist Dr. Lovena Le at [facility] to start 1-2mg /kg steroids with taper, IV fluids and to monitor. Pt overall stable. Will continue to trend labs and monitor closely.  -IV solu-medrol 125mg  x 1. Then 60mg  tomorrow and can continue to taper.  -IV Benadryl 12.5mg  q6hr PRN puritus   Leukocytosis suspect reactive to allergic reaction symptoms of her tooth decay has improved and facial swelling has resolved  Tachycardia EKG showed left axis deviation and sinus tachycardia on my review  no other cardiac symptom -monitor on continuous telemetry with IV fluids   Restless legs continue Requip   GERD continue PPI   Chronic hip pain continue PRN Tramadol   DVT prophylaxis:.Lovenox Code Status: Full Family Communication: Plan discussed with patient and husband at bedside  disposition Plan: Home with at least 2 midnight stays  Consults called:  Admission status: inpatient  Level of care: Telemetry Cardiac  Status is: Inpatient  Remains inpatient appropriate because: Admit - It is my clinical opinion that admission to INPATIENT is reasonable and necessary because this patient will require at least 2 midnights in the  hospital to treat this condition based on the medical complexity of the problems presented.  Given the aforementioned information, the predictability of an adverse outcome is felt to be significant.         Orene Desanctis DO [facility]   If 7PM-7AM, please contact night-coverage [email or web address]   12/03/2021, 12:23 AM

## 2021-12-03 NOTE — ED Notes (Signed)
Pt took her home Requip, ok'd by Dr.Tegeler

## 2021-12-04 DIAGNOSIS — R21 Rash and other nonspecific skin eruption: Secondary | ICD-10-CM | POA: Diagnosis not present

## 2021-12-04 DIAGNOSIS — T7840XD Allergy, unspecified, subsequent encounter: Secondary | ICD-10-CM | POA: Diagnosis not present

## 2021-12-04 DIAGNOSIS — R509 Fever, unspecified: Secondary | ICD-10-CM | POA: Diagnosis not present

## 2021-12-04 LAB — CBC WITH DIFFERENTIAL/PLATELET
Abs Immature Granulocytes: 0.08 10*3/uL — ABNORMAL HIGH (ref 0.00–0.07)
Basophils Absolute: 0 10*3/uL (ref 0.0–0.1)
Basophils Relative: 0 %
Eosinophils Absolute: 0 10*3/uL (ref 0.0–0.5)
Eosinophils Relative: 0 %
HCT: 38.6 % (ref 36.0–46.0)
Hemoglobin: 13.2 g/dL (ref 12.0–15.0)
Immature Granulocytes: 1 %
Lymphocytes Relative: 17 %
Lymphs Abs: 2.1 10*3/uL (ref 0.7–4.0)
MCH: 31.5 pg (ref 26.0–34.0)
MCHC: 34.2 g/dL (ref 30.0–36.0)
MCV: 92.1 fL (ref 80.0–100.0)
Monocytes Absolute: 0.3 10*3/uL (ref 0.1–1.0)
Monocytes Relative: 2 %
Neutro Abs: 9.6 10*3/uL — ABNORMAL HIGH (ref 1.7–7.7)
Neutrophils Relative %: 80 %
Platelets: 194 10*3/uL (ref 150–400)
RBC: 4.19 MIL/uL (ref 3.87–5.11)
RDW: 13.3 % (ref 11.5–15.5)
WBC: 12.1 10*3/uL — ABNORMAL HIGH (ref 4.0–10.5)
nRBC: 0 % (ref 0.0–0.2)

## 2021-12-04 LAB — BASIC METABOLIC PANEL
Anion gap: 7 (ref 5–15)
BUN: 15 mg/dL (ref 8–23)
CO2: 26 mmol/L (ref 22–32)
Calcium: 8.6 mg/dL — ABNORMAL LOW (ref 8.9–10.3)
Chloride: 106 mmol/L (ref 98–111)
Creatinine, Ser: 0.86 mg/dL (ref 0.44–1.00)
GFR, Estimated: >60 mL/min (ref >60)
Glucose, Bld: 135 mg/dL — ABNORMAL HIGH (ref 70–99)
Potassium: 3.8 mmol/L (ref 3.5–5.1)
Sodium: 139 mmol/L (ref 135–145)

## 2021-12-04 LAB — MAGNESIUM: Magnesium: 2.1 mg/dL (ref 1.7–2.4)

## 2021-12-04 MED ORDER — PREDNISONE 20 MG PO TABS
40.0000 mg | ORAL_TABLET | Freq: Every day | ORAL | Status: DC
  Administered 2021-12-04 – 2021-12-05 (×2): 40 mg via ORAL
  Filled 2021-12-04 (×2): qty 2

## 2021-12-04 NOTE — Assessment & Plan Note (Signed)
Chronic hip pain -Continue home regimen of tramadol and Requip

## 2021-12-04 NOTE — TOC Progression Note (Signed)
Transition of Care [facility]) - Progression Note    Patient Details  Name: [name] MRN: [id number] Date of Birth: 24-Feb-1956  Transition of Care [facility] CM/SW Contact  Zenon Mayo, RN Phone Number: 12/04/2021, 1:04 PM  Clinical Narrative:    Patient is from home, had allergic reaction , fever and rash to the abx , thinks it was clindamycin.  She is on iv solumedrol, she is indep. Has no needs. TOC will continue to follow for dc needs. Plan for dc tomorrow per MD.        Expected Discharge Plan and Services                                                 Social Determinants of Health (SDOH) Interventions    Readmission Risk Interventions No flowsheet data found.

## 2021-12-04 NOTE — Care Management Obs Status (Signed)
MEDICARE OBSERVATION STATUS NOTIFICATION   Patient Details  Name: Kiara King MRN: [id number] Date of Birth: 1955/11/24   Medicare Observation Status Notification Given:  Yes    Zenon Mayo, RN 12/04/2021, 9:59 AM

## 2021-12-04 NOTE — Progress Notes (Signed)
PT Cancellation Note  Patient Details Name: Kiara King MRN: [id number] DOB: June 07, 1956   Cancelled Treatment:    Reason Eval/Treat Not Completed: PT screened, no needs identified, will sign off. PT and spouse both report the pt has been ambulating within the room without issue. Pt denies any mobility deficits at this time. Acute PT signing off.   Zenaida Niece 12/04/2021, 11:17 AM

## 2021-12-04 NOTE — Assessment & Plan Note (Signed)
DRESS syndrome -Following clindamycin use, no disclamation or mucosal involvement -Clinically improving on IV steroids, will transition to prednisone this evening -Monitor new areas -Benadryl as needed

## 2021-12-04 NOTE — Assessment & Plan Note (Signed)
Continue PPI ?

## 2021-12-04 NOTE — Progress Notes (Signed)
PROGRESS NOTE    Kiara King  TAV:[id number] DOB: 1956-06-09 DOA: 12/02/2021 PCP: Hinton Lovely, MD  Brief Narrative: 65/M with history of asthma, GERD, OSA, restless leg syndrome presented to the ED with diffuse drug rash following clindamycin use for 3 days for a dental infection.  Subjective: -Feels better today, some of the rash is improving, reports mild worsening of the face, dorsum of both feet  Assessment & Plan: * Allergic reaction caused by a drug- (present on admission) DRESS syndrome -Following clindamycin use, no disclamation or mucosal involvement -Clinically improving on IV steroids, will transition to prednisone this evening -Monitor new areas -Benadryl as needed  GERD (gastroesophageal reflux disease)- (present on admission) Continue PPI  Restless leg Chronic hip pain -Continue home regimen of tramadol and Requip   DVT prophylaxis: Lovenox Code Status: Full code Family Communication: Spouse at bedside Disposition Plan: Home tomorrow Observation  Consultants:  Case discussed with dermatology on admission  Procedures:   Antimicrobials:    Objective: Vitals:   12/03/21 2352 12/04/21 0409 12/04/21 0827 12/04/21 1116  BP: (!) 109/52 (!) 111/59 (!) 114/54 (!) 103/55  Pulse: 84 86 83 82  Resp:  18 18 18   Temp: 98.6 F (37 C) 98.6 F (37 C) 98.5 F (36.9 C) 98.5 F (36.9 C)  TempSrc: Oral Oral Oral Oral  SpO2: 97% 94% 95% 97%  Weight:  102.7 kg      Intake/Output Summary (Last 24 hours) at 12/04/2021 1202 Last data filed at 12/04/2021 1016 Gross per 24 hour  Intake 820 ml  Output 200 ml  Net 620 ml   Filed Weights   12/04/21 0409  Weight: 102.7 kg    Examination:  General exam: Appears calm and comfortable  Respiratory system: Clear to auscultation Cardiovascular system: S1 & S2 heard, RRR.  Abd: nondistended, soft and nontender.Normal bowel sounds heard. Central nervous system: Alert and oriented. No focal neurological  deficits. Extremities: no edema Skin: Diffuse maculopapular rash involving arms, abdomen, neck, face, more erythematous on forehead, malar surface, dorsum of both feet Psychiatry:  Mood & affect appropriate.     Data Reviewed:   CBC: Recent Labs  Lab 12/02/21 1826 12/03/21 0301 12/04/21 0413  WBC 13.0* 11.2* 12.1*  NEUTROABS 10.4*  --  9.6*  HGB 16.7* 14.7 13.2  HCT 48.5* 42.9 38.6  MCV 90.8 92.3 92.1  PLT 262 214 553   Basic Metabolic Panel: Recent Labs  Lab 12/02/21 1826 12/03/21 0301 12/04/21 0413  NA 136 134* 139  K 4.0 4.7 3.8  CL 103 102 106  CO2 22 22 26   GLUCOSE 113* 195* 135*  BUN 18 16 15   CREATININE 1.10* 1.00 0.86  CALCIUM 8.8* 8.2* 8.6*  MG  --   --  2.1   GFR: CrCl cannot be calculated (Unknown ideal weight.). Liver Function Tests: Recent Labs  Lab 12/02/21 1837 12/03/21 0301  AST 18 20  ALT 17 16  ALKPHOS 71 60  BILITOT 1.5* 1.9*  PROT 6.8 6.0*  ALBUMIN 4.1 3.4*   No results for input(s): LIPASE, AMYLASE in the last 168 hours. No results for input(s): AMMONIA in the last 168 hours. Coagulation Profile: No results for input(s): INR, PROTIME in the last 168 hours. Cardiac Enzymes: No results for input(s): CKTOTAL, CKMB, CKMBINDEX, TROPONINI in the last 168 hours. BNP (last 3 results) No results for input(s): PROBNP in the last 8760 hours. HbA1C: No results for input(s): HGBA1C in the last 72 hours. CBG: No results for  input(s): GLUCAP in the last 168 hours. Lipid Profile: No results for input(s): CHOL, HDL, LDLCALC, TRIG, CHOLHDL, LDLDIRECT in the last 72 hours. Thyroid Function Tests: No results for input(s): TSH, T4TOTAL, FREET4, T3FREE, THYROIDAB in the last 72 hours. Anemia Panel: No results for input(s): VITAMINB12, FOLATE, FERRITIN, TIBC, IRON, RETICCTPCT in the last 72 hours. Urine analysis: No results found for: COLORURINE, APPEARANCEUR, LABSPEC, PHURINE, GLUCOSEU, HGBUR, BILIRUBINUR, KETONESUR, PROTEINUR, UROBILINOGEN,  NITRITE, LEUKOCYTESUR Sepsis Labs: @LABRCNTIP (procalcitonin:4,lacticidven:4)  ) Recent Results (from the past 240 hour(s))  Resp Panel by RT-PCR (Flu A&B, Covid) Nasopharyngeal Swab     Status: None   Collection Time: 12/02/21  8:27 PM   Specimen: Nasopharyngeal Swab; Nasopharyngeal(NP) swabs in vial transport medium  Result Value Ref Range Status   SARS Coronavirus 2 by RT PCR NEGATIVE NEGATIVE Final    Comment: (NOTE) SARS-CoV-2 target nucleic acids are NOT DETECTED.  The SARS-CoV-2 RNA is generally detectable in upper respiratory specimens during the acute phase of infection. The lowest concentration of SARS-CoV-2 viral copies this assay can detect is 138 copies/mL. A negative result does not preclude SARS-Cov-2 infection and should not be used as the sole basis for treatment or other patient management decisions. A negative result may occur with  improper specimen collection/handling, submission of specimen other than nasopharyngeal swab, presence of viral mutation(s) within the areas targeted by this assay, and inadequate number of viral copies(<138 copies/mL). A negative result must be combined with clinical observations, patient history, and epidemiological information. The expected result is Negative.  Fact Sheet for Patients:  EntrepreneurPulse.com.au  Fact Sheet for Healthcare Providers:  IncredibleEmployment.be  This test is no t yet approved or cleared by the [place] FDA and  has been authorized for detection and/or diagnosis of SARS-CoV-2 by FDA under an Emergency Use Authorization (EUA). This EUA will remain  in effect (meaning this test can be used) for the duration of the COVID-19 declaration under Section 564(b)(1) of the Act, 21 U.S.C.section 360bbb-3(b)(1), unless the authorization is terminated  or revoked sooner.       Influenza A by PCR NEGATIVE NEGATIVE Final   Influenza B by PCR NEGATIVE NEGATIVE Final     Comment: (NOTE) The Xpert Xpress SARS-CoV-2/FLU/RSV plus assay is intended as an aid in the diagnosis of influenza from Nasopharyngeal swab specimens and should not be used as a sole basis for treatment. Nasal washings and aspirates are unacceptable for Xpert Xpress SARS-CoV-2/FLU/RSV testing.  Fact Sheet for Patients: EntrepreneurPulse.com.au  Fact Sheet for Healthcare Providers: IncredibleEmployment.be  This test is not yet approved or cleared by the [place] FDA and has been authorized for detection and/or diagnosis of SARS-CoV-2 by FDA under an Emergency Use Authorization (EUA). This EUA will remain in effect (meaning this test can be used) for the duration of the COVID-19 declaration under Section 564(b)(1) of the Act, 21 U.S.C. section 360bbb-3(b)(1), unless the authorization is terminated or revoked.  Performed at [facility], [place] [address], [place], [place] [address]      Radiology Studies: DG Chest 1 View  Result Date: 12/02/2021 CLINICAL DATA:  Allergic reaction to medication. EXAM: CHEST  1 VIEW COMPARISON:  None. FINDINGS: The heart size and mediastinal contours are within normal limits. Both lungs are clear. Multilevel degenerative changes seen throughout the thoracic spine. IMPRESSION: No active disease. Electronically Signed   By: Virgina Norfolk M.D.   On: 12/02/2021 19:47     Scheduled Meds:  aspirin EC  81 mg Oral Daily  enoxaparin (LOVENOX) injection  40 mg Subcutaneous Daily   predniSONE  40 mg Oral Daily   rOPINIRole  3 mg Oral QHS   Continuous Infusions:  famotidine (PEPCID) IV 20 mg (12/04/21 0944)     LOS: 1 day    Time spent: 52min    Domenic Polite, MD [facility]   12/04/2021, 12:02 PM

## 2021-12-04 NOTE — Care Management CC44 (Signed)
Condition Code 44 Documentation Completed  Patient Details  Name: Kiara King MRN: [id number] Date of Birth: 1956/10/30   Condition Code 44 given:  Yes Patient signature on Condition Code 44 notice:  Yes Documentation of 2 MD's agreement:  Yes Code 44 added to claim:  Yes    Zenon Mayo, RN 12/04/2021, 9:59 AM

## 2021-12-05 DIAGNOSIS — T7840XD Allergy, unspecified, subsequent encounter: Secondary | ICD-10-CM | POA: Diagnosis not present

## 2021-12-05 MED ORDER — PREDNISONE 20 MG PO TABS: 20.0000 mg | ORAL_TABLET | Freq: Every day | ORAL | 0 refills | Status: AC

## 2021-12-05 NOTE — Discharge Summary (Signed)
Physician Discharge Summary  Kiara King VZC:[id number] DOB: 1956-09-01 DOA: 12/02/2021  PCP: Hinton Lovely, MD  Admit date: 12/02/2021 Discharge date: 12/05/2021  Time spent:30  minutes  Recommendations for Outpatient Follow-up:  PCP in 1 to 2 weeks   Discharge Diagnoses:  Principal Problem:   Allergic reaction caused by a drug Active Problems:   Leukocytosis   Tachycardia   Restless leg   GERD (gastroesophageal reflux disease)   Diffuse papular rash   Discharge Condition: Stable  Diet recommendation: Heart healthy  Filed Weights   12/04/21 0409 12/05/21 0100  Weight: 102.7 kg 103 kg    History of present illness:  65/M with history of asthma, GERD, OSA, restless leg syndrome presented to the ED with diffuse drug rash following clindamycin use for 3 days for a dental infection  Hospital Course:   Allergic reaction caused by a drug- (present on admission) DRESS syndrome -Following clindamycin use, no disclamation or mucosal involvement -Clinically improving on IV steroids, transitioned to prednisone taper -Charged home on oral prednisone for 2 more days   GERD (gastroesophageal reflux disease)- (present on admission) Continue PPI   Restless leg Chronic hip pain -Continue home regimen of tramadol and Requip    Discharge Exam: Vitals:   12/04/21 2015 12/05/21 0428  BP: (!) 119/59 112/62  Pulse:  73  Resp:  17  Temp: 98.9 F (37.2 C) 97.6 F (36.4 C)  SpO2: 94% 94%   Gen: Awake, Alert, Oriented X 3,  HEENT: no JVD Lungs: Good air movement bilaterally, CTAB CVS: S1S2/RRR Abd: soft, Non tender, non distended, BS present Extremities: No edema Skin: Rash improving  Discharge Instructions   Discharge Instructions     Diet - low sodium heart healthy   Complete by: As directed    Increase activity slowly   Complete by: As directed       Allergies as of 12/05/2021       Reactions   Morphine And Related Nausea And Vomiting   Tomato  Hives   Clindamycin/lincomycin Rash        Medication List     STOP taking these medications    amoxicillin-clavulanate 875-125 MG tablet Commonly known as: AUGMENTIN       TAKE these medications    acetaminophen 500 MG tablet Commonly known as: TYLENOL Take 500 mg by mouth every 6 (six) hours as needed for mild pain.   aspirin EC 81 MG tablet Take 81 mg by mouth daily. Swallow whole.   DULoxetine 60 MG capsule Commonly known as: CYMBALTA Take 60 mg by mouth daily.   OMEPRAZOLE PO Take 1 capsule by mouth daily.   predniSONE 20 MG tablet Commonly known as: DELTASONE Take 1 tablet (20 mg total) by mouth daily for 2 days. Start taking on: December 06, 2021   rOPINIRole 3 MG tablet Commonly known as: REQUIP Take 3 mg by mouth at bedtime.   traMADol 50 MG tablet Commonly known as: ULTRAM Take 50 mg by mouth 3 (three) times daily as needed for moderate pain.       Allergies  Allergen Reactions   Morphine And Related Nausea And Vomiting   Tomato Hives   Clindamycin/Lincomycin Rash    Follow-up Information     Garlan Fair III, MD. Schedule an appointment as soon as possible for a visit in 1 week(s).   Specialty: Internal Medicine Why: Please follow up in a week.  The results of significant diagnostics from this hospitalization (including imaging, microbiology, ancillary and laboratory) are listed below for reference.    Significant Diagnostic Studies: DG Chest 1 View  Result Date: 12/02/2021 CLINICAL DATA:  Allergic reaction to medication. EXAM: CHEST  1 VIEW COMPARISON:  None. FINDINGS: The heart size and mediastinal contours are within normal limits. Both lungs are clear. Multilevel degenerative changes seen throughout the thoracic spine. IMPRESSION: No active disease. Electronically Signed   By: Virgina Norfolk M.D.   On: 12/02/2021 19:47    Microbiology: Recent Results (from the past 240 hour(s))  Resp Panel by RT-PCR  (Flu A&B, Covid) Nasopharyngeal Swab     Status: None   Collection Time: 12/02/21  8:27 PM   Specimen: Nasopharyngeal Swab; Nasopharyngeal(NP) swabs in vial transport medium  Result Value Ref Range Status   SARS Coronavirus 2 by RT PCR NEGATIVE NEGATIVE Final    Comment: (NOTE) SARS-CoV-2 target nucleic acids are NOT DETECTED.  The SARS-CoV-2 RNA is generally detectable in upper respiratory specimens during the acute phase of infection. The lowest concentration of SARS-CoV-2 viral copies this assay can detect is 138 copies/mL. A negative result does not preclude SARS-Cov-2 infection and should not be used as the sole basis for treatment or other patient management decisions. A negative result may occur with  improper specimen collection/handling, submission of specimen other than nasopharyngeal swab, presence of viral mutation(s) within the areas targeted by this assay, and inadequate number of viral copies(<138 copies/mL). A negative result must be combined with clinical observations, patient history, and epidemiological information. The expected result is Negative.  Fact Sheet for Patients:  EntrepreneurPulse.com.au  Fact Sheet for Healthcare Providers:  IncredibleEmployment.be  This test is no t yet approved or cleared by the [place] FDA and  has been authorized for detection and/or diagnosis of SARS-CoV-2 by FDA under an Emergency Use Authorization (EUA). This EUA will remain  in effect (meaning this test can be used) for the duration of the COVID-19 declaration under Section 564(b)(1) of the Act, 21 U.S.C.section 360bbb-3(b)(1), unless the authorization is terminated  or revoked sooner.       Influenza A by PCR NEGATIVE NEGATIVE Final   Influenza B by PCR NEGATIVE NEGATIVE Final    Comment: (NOTE) The Xpert Xpress SARS-CoV-2/FLU/RSV plus assay is intended as an aid in the diagnosis of influenza from Nasopharyngeal swab specimens  and should not be used as a sole basis for treatment. Nasal washings and aspirates are unacceptable for Xpert Xpress SARS-CoV-2/FLU/RSV testing.  Fact Sheet for Patients: EntrepreneurPulse.com.au  Fact Sheet for Healthcare Providers: IncredibleEmployment.be  This test is not yet approved or cleared by the [place] FDA and has been authorized for detection and/or diagnosis of SARS-CoV-2 by FDA under an Emergency Use Authorization (EUA). This EUA will remain in effect (meaning this test can be used) for the duration of the COVID-19 declaration under Section 564(b)(1) of the Act, 21 U.S.C. section 360bbb-3(b)(1), unless the authorization is terminated or revoked.  Performed at [facility], [place] [address] [place], [place] [address]      Labs: Basic Metabolic Panel: Recent Labs  Lab 12/02/21 1826 12/03/21 0301 12/04/21 0413  NA 136 134* 139  K 4.0 4.7 3.8  CL 103 102 106  CO2 22 22 26   GLUCOSE 113* 195* 135*  BUN 18 16 15   CREATININE 1.10* 1.00 0.86  CALCIUM 8.8* 8.2* 8.6*  MG  --   --  2.1   Liver Function Tests: Recent  Labs  Lab 12/02/21 1837 12/03/21 0301  AST 18 20  ALT 17 16  ALKPHOS 71 60  BILITOT 1.5* 1.9*  PROT 6.8 6.0*  ALBUMIN 4.1 3.4*   No results for input(s): LIPASE, AMYLASE in the last 168 hours. No results for input(s): AMMONIA in the last 168 hours. CBC: Recent Labs  Lab 12/02/21 1826 12/03/21 0301 12/04/21 0413  WBC 13.0* 11.2* 12.1*  NEUTROABS 10.4*  --  9.6*  HGB 16.7* 14.7 13.2  HCT 48.5* 42.9 38.6  MCV 90.8 92.3 92.1  PLT 262 214 194   Cardiac Enzymes: No results for input(s): CKTOTAL, CKMB, CKMBINDEX, TROPONINI in the last 168 hours. BNP: BNP (last 3 results) No results for input(s): BNP in the last 8760 hours.  ProBNP (last 3 results) No results for input(s): PROBNP in the last 8760 hours.  CBG: No results for input(s): GLUCAP in the last 168  hours.     Signed:  Domenic Polite MD.  [facility] 12/05/2021, 2:24 PM

## 2021-12-19 ENCOUNTER — Ambulatory Visit: Payer: Self-pay | Admitting: Allergy

## 2022-01-07 ENCOUNTER — Ambulatory Visit: Payer: Self-pay | Admitting: Allergy

## 2023-01-28 IMAGING — CR DG CHEST 1V
1 series · 1 of 1 positions shown · non-contrast
Comparison: None.

CLINICAL DATA: Allergic reaction to medication.

EXAM:
CHEST  1 VIEW

[chest pa]
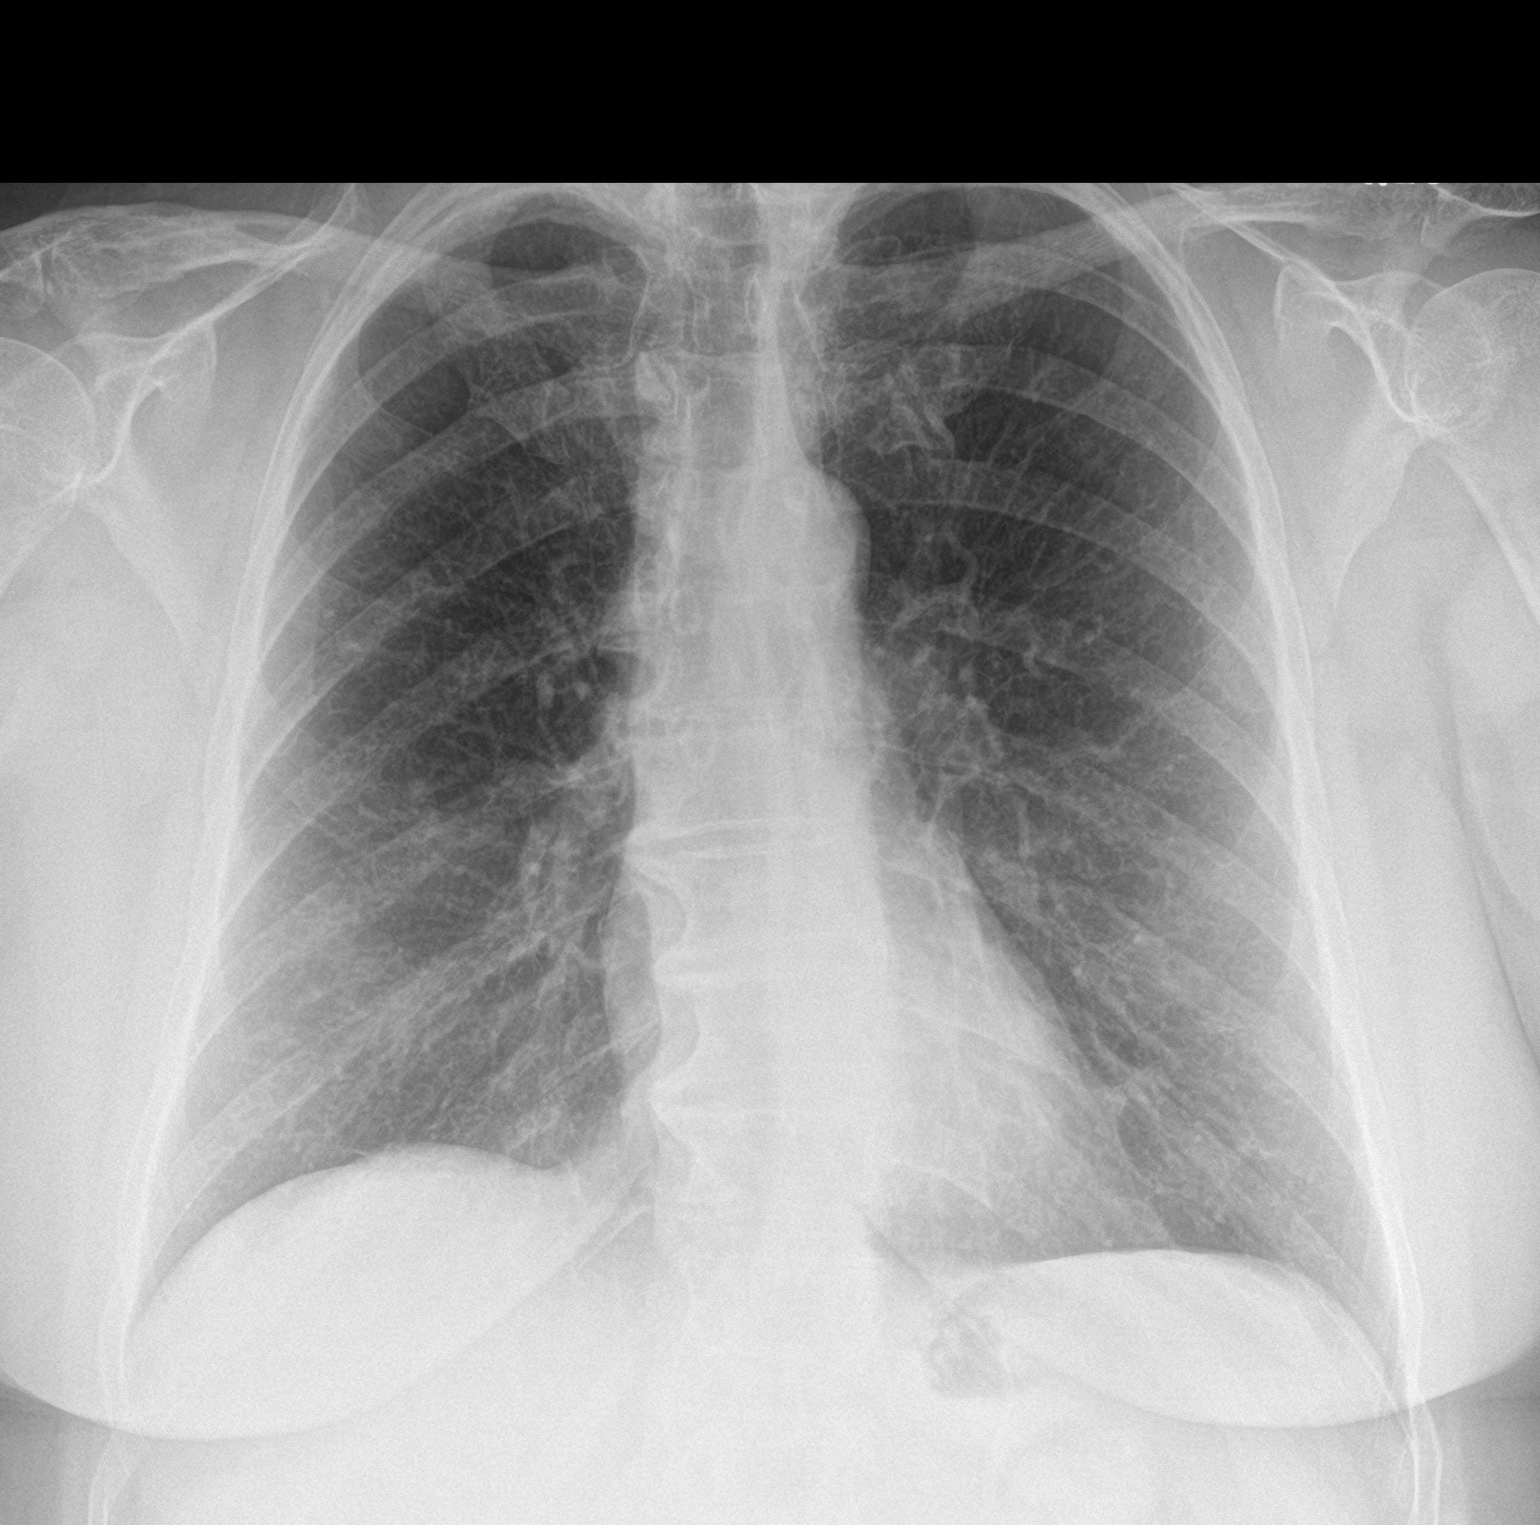

[1 of 1 positions shown; findings below may reference images not displayed]

FINDINGS: The heart size and mediastinal contours are within normal limits.
Both lungs are clear. Multilevel degenerative changes seen
throughout the thoracic spine.
IMPRESSION: No active disease.
